# Patient Record
Sex: Female | Born: 1990 | Race: White | Hispanic: No | Marital: Single | Smoking: Current every day smoker
Health system: Southern US, Community
[De-identification: ages and names within clinical notes are randomized; demographics above are authoritative.]

## PROBLEM LIST (undated history)

## (undated) DIAGNOSIS — B1920 Unspecified viral hepatitis C without hepatic coma: Secondary | ICD-10-CM

## (undated) DIAGNOSIS — W3400XA Accidental discharge from unspecified firearms or gun, initial encounter: Secondary | ICD-10-CM

## (undated) HISTORY — PX: APPENDECTOMY: SHX54

---

## 2016-01-19 ENCOUNTER — Emergency Department: Payer: Self-pay

## 2016-01-19 ENCOUNTER — Encounter: Payer: Self-pay | Admitting: *Deleted

## 2016-01-19 ENCOUNTER — Emergency Department
Admission: EM | Admit: 2016-01-19 | Discharge: 2016-01-19 | Disposition: A | Discharge status: Home / Self Care | Payer: Self-pay | Attending: Emergency Medicine | Admitting: Emergency Medicine

## 2016-01-19 DIAGNOSIS — F17210 Nicotine dependence, cigarettes, uncomplicated: Secondary | ICD-10-CM | POA: Insufficient documentation

## 2016-01-19 DIAGNOSIS — K5900 Constipation, unspecified: Secondary | ICD-10-CM

## 2016-01-19 DIAGNOSIS — K567 Ileus, unspecified: Secondary | ICD-10-CM | POA: Insufficient documentation

## 2016-01-19 HISTORY — DX: Unspecified viral hepatitis C without hepatic coma: B19.20

## 2016-01-19 HISTORY — DX: Accidental discharge from unspecified firearms or gun, initial encounter: W34.00XA

## 2016-01-19 LAB — URINALYSIS, COMPLETE (UACMP) WITH MICROSCOPIC
BACTERIA UA: NONE SEEN
Glucose, UA: NEGATIVE mg/dL
Hgb urine dipstick: NEGATIVE
KETONES UR: NEGATIVE mg/dL
Nitrite: NEGATIVE
PROTEIN: NEGATIVE mg/dL
Specific Gravity, Urine: 1.026 (ref 1.005–1.030)
pH: 7 (ref 5.0–8.0)

## 2016-01-19 LAB — POCT PREGNANCY, URINE: Preg Test, Ur: NEGATIVE

## 2016-01-19 LAB — COMPREHENSIVE METABOLIC PANEL
ALBUMIN: 4.7 g/dL (ref 3.5–5.0)
ALT: 15 U/L (ref 14–54)
AST: 23 U/L (ref 15–41)
Alkaline Phosphatase: 69 U/L (ref 38–126)
Anion gap: 8 (ref 5–15)
BUN: 12 mg/dL (ref 6–20)
CHLORIDE: 106 mmol/L (ref 101–111)
CO2: 26 mmol/L (ref 22–32)
CREATININE: 0.84 mg/dL (ref 0.44–1.00)
Calcium: 9.3 mg/dL (ref 8.9–10.3)
GFR calc Af Amer: >60 mL/min (ref >60)
GFR calc non Af Amer: >60 mL/min (ref >60)
GLUCOSE: 115 mg/dL — AB (ref 65–99)
POTASSIUM: 3.8 mmol/L (ref 3.5–5.1)
Sodium: 140 mmol/L (ref 135–145)
TOTAL PROTEIN: 8.2 g/dL — AB (ref 6.5–8.1)

## 2016-01-19 LAB — CBC
HEMATOCRIT: 42.3 % (ref 35.0–47.0)
Hemoglobin: 14.4 g/dL (ref 12.0–16.0)
MCH: 31.1 pg (ref 26.0–34.0)
MCHC: 34 g/dL (ref 32.0–36.0)
MCV: 91.5 fL (ref 80.0–100.0)
Platelets: 339 10*3/uL (ref 150–440)
RBC: 4.62 MIL/uL (ref 3.80–5.20)
RDW: 14.4 % (ref 11.5–14.5)
WBC: 9.9 10*3/uL (ref 3.6–11.0)

## 2016-01-19 LAB — LIPASE, BLOOD: Lipase: 35 U/L (ref 11–51)

## 2016-01-19 MED ORDER — SENNOSIDES-DOCUSATE SODIUM 8.6-50 MG PO TABS: 2.0000 | ORAL_TABLET | Freq: Two times a day (BID) | ORAL | 0 refills | Status: AC

## 2016-01-19 MED ORDER — SENNOSIDES-DOCUSATE SODIUM 8.6-50 MG PO TABS
2.0000 | ORAL_TABLET | Freq: Once | ORAL | Status: AC
  Administered 2016-01-19: 2 via ORAL
  Filled 2016-01-19: qty 2
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
## 2016-01-19 NOTE — ED Notes (Signed)
Patient transported to X-ray 
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
## 2016-01-21 ENCOUNTER — Encounter: Payer: Self-pay | Admitting: Emergency Medicine

## 2016-01-21 ENCOUNTER — Observation Stay
Admission: EM | Admit: 2016-01-21 | Discharge: 2016-01-23 | Disposition: A | Discharge status: Home / Self Care | Payer: Self-pay | Attending: Internal Medicine | Admitting: Internal Medicine

## 2016-01-21 ENCOUNTER — Emergency Department: Payer: Self-pay

## 2016-01-21 DIAGNOSIS — G4700 Insomnia, unspecified: Secondary | ICD-10-CM | POA: Insufficient documentation

## 2016-01-21 DIAGNOSIS — F17200 Nicotine dependence, unspecified, uncomplicated: Secondary | ICD-10-CM | POA: Insufficient documentation

## 2016-01-21 DIAGNOSIS — R1084 Generalized abdominal pain: Secondary | ICD-10-CM | POA: Insufficient documentation

## 2016-01-21 DIAGNOSIS — F1120 Opioid dependence, uncomplicated: Secondary | ICD-10-CM

## 2016-01-21 DIAGNOSIS — Z880 Allergy status to penicillin: Secondary | ICD-10-CM | POA: Insufficient documentation

## 2016-01-21 DIAGNOSIS — F1123 Opioid dependence with withdrawal: Principal | ICD-10-CM | POA: Insufficient documentation

## 2016-01-21 DIAGNOSIS — A084 Viral intestinal infection, unspecified: Secondary | ICD-10-CM | POA: Insufficient documentation

## 2016-01-21 DIAGNOSIS — R112 Nausea with vomiting, unspecified: Secondary | ICD-10-CM | POA: Diagnosis present

## 2016-01-21 DIAGNOSIS — B1920 Unspecified viral hepatitis C without hepatic coma: Secondary | ICD-10-CM | POA: Insufficient documentation

## 2016-01-21 DIAGNOSIS — F1193 Opioid use, unspecified with withdrawal: Secondary | ICD-10-CM

## 2016-01-21 DIAGNOSIS — Z23 Encounter for immunization: Secondary | ICD-10-CM | POA: Insufficient documentation

## 2016-01-21 LAB — CBC
HCT: 38.7 % (ref 35.0–47.0)
Hemoglobin: 13.4 g/dL (ref 12.0–16.0)
MCH: 31.4 pg (ref 26.0–34.0)
MCHC: 34.7 g/dL (ref 32.0–36.0)
MCV: 90.4 fL (ref 80.0–100.0)
PLATELETS: 338 10*3/uL (ref 150–440)
RBC: 4.28 MIL/uL (ref 3.80–5.20)
RDW: 14.4 % (ref 11.5–14.5)
WBC: 15.8 10*3/uL — AB (ref 3.6–11.0)

## 2016-01-21 LAB — COMPREHENSIVE METABOLIC PANEL
ALBUMIN: 4.4 g/dL (ref 3.5–5.0)
ALK PHOS: 68 U/L (ref 38–126)
ALT: 15 U/L (ref 14–54)
AST: 25 U/L (ref 15–41)
Anion gap: 11 (ref 5–15)
BILIRUBIN TOTAL: 0.4 mg/dL (ref 0.3–1.2)
BUN: 9 mg/dL (ref 6–20)
CALCIUM: 9.4 mg/dL (ref 8.9–10.3)
CO2: 20 mmol/L — ABNORMAL LOW (ref 22–32)
CREATININE: 0.73 mg/dL (ref 0.44–1.00)
Chloride: 111 mmol/L (ref 101–111)
GFR calc Af Amer: >60 mL/min (ref >60)
GLUCOSE: 111 mg/dL — AB (ref 65–99)
Potassium: 4.2 mmol/L (ref 3.5–5.1)
Sodium: 142 mmol/L (ref 135–145)
TOTAL PROTEIN: 7.8 g/dL (ref 6.5–8.1)

## 2016-01-21 LAB — URINALYSIS, COMPLETE (UACMP) WITH MICROSCOPIC
BACTERIA UA: NONE SEEN
Bilirubin Urine: NEGATIVE
GLUCOSE, UA: NEGATIVE mg/dL
Ketones, ur: NEGATIVE mg/dL
Leukocytes, UA: NEGATIVE
NITRITE: NEGATIVE
PH: 7 (ref 5.0–8.0)
Protein, ur: NEGATIVE mg/dL
SPECIFIC GRAVITY, URINE: 1.021 (ref 1.005–1.030)

## 2016-01-21 LAB — URINE DRUG SCREEN, QUALITATIVE (ARMC ONLY)
AMPHETAMINES, UR SCREEN: NOT DETECTED
Barbiturates, Ur Screen: POSITIVE — AB
Benzodiazepine, Ur Scrn: POSITIVE — AB
CANNABINOID 50 NG, UR ~~LOC~~: NOT DETECTED
COCAINE METABOLITE, UR ~~LOC~~: NOT DETECTED
MDMA (ECSTASY) UR SCREEN: NOT DETECTED
Methadone Scn, Ur: NOT DETECTED
Opiate, Ur Screen: NOT DETECTED
PHENCYCLIDINE (PCP) UR S: NOT DETECTED
TRICYCLIC, UR SCREEN: POSITIVE — AB

## 2016-01-21 LAB — LIPASE, BLOOD: Lipase: 28 U/L (ref 11–51)

## 2016-01-21 LAB — OCCULT BLOOD X 1 CARD TO LAB, STOOL: Fecal Occult Bld: NEGATIVE

## 2016-01-21 MED ORDER — ACETAMINOPHEN 325 MG PO TABS
650.0000 mg | ORAL_TABLET | Freq: Four times a day (QID) | ORAL | Status: DC | PRN
  Administered 2016-01-21 – 2016-01-23 (×5): 650 mg via ORAL
  Filled 2016-01-21 (×6): qty 2

## 2016-01-21 MED ORDER — CLONIDINE HCL 0.1 MG/24HR TD PTWK
0.1000 mg | MEDICATED_PATCH | TRANSDERMAL | Status: DC
  Administered 2016-01-21: 0.1 mg via TRANSDERMAL
  Filled 2016-01-21: qty 1

## 2016-01-21 MED ORDER — PNEUMOCOCCAL VAC POLYVALENT 25 MCG/0.5ML IJ INJ
0.5000 mL | INJECTION | INTRAMUSCULAR | Status: AC
  Administered 2016-01-22: 0.5 mL via INTRAMUSCULAR
  Filled 2016-01-21: qty 0.5

## 2016-01-21 MED ORDER — ONDANSETRON HCL 4 MG/2ML IJ SOLN
4.0000 mg | Freq: Once | INTRAMUSCULAR | Status: AC
  Administered 2016-01-21: 4 mg via INTRAVENOUS

## 2016-01-21 MED ORDER — DIPHENHYDRAMINE HCL 50 MG/ML IJ SOLN
12.5000 mg | Freq: Three times a day (TID) | INTRAMUSCULAR | Status: DC | PRN
  Administered 2016-01-21 – 2016-01-22 (×3): 12.5 mg via INTRAVENOUS
  Filled 2016-01-21 (×3): qty 1

## 2016-01-21 MED ORDER — ONDANSETRON 4 MG PO TBDP
4.0000 mg | ORAL_TABLET | Freq: Once | ORAL | Status: AC | PRN
  Administered 2016-01-21: 4 mg via ORAL

## 2016-01-21 MED ORDER — KETOROLAC TROMETHAMINE 15 MG/ML IJ SOLN
15.0000 mg | Freq: Four times a day (QID) | INTRAMUSCULAR | Status: DC | PRN
  Administered 2016-01-21 – 2016-01-23 (×7): 15 mg via INTRAVENOUS
  Filled 2016-01-21 (×7): qty 1

## 2016-01-21 MED ORDER — SODIUM CHLORIDE 0.9 % IV BOLUS (SEPSIS)
1000.0000 mL | Freq: Once | INTRAVENOUS | Status: AC
  Administered 2016-01-21: 1000 mL via INTRAVENOUS

## 2016-01-21 MED ORDER — ONDANSETRON 4 MG PO TBDP
ORAL_TABLET | ORAL | Status: AC
  Administered 2016-01-21: 4 mg via ORAL
  Filled 2016-01-21: qty 1

## 2016-01-21 MED ORDER — BUPRENORPHINE HCL 2 MG SL SUBL
2.0000 mg | SUBLINGUAL_TABLET | Freq: Once | SUBLINGUAL | Status: AC
  Administered 2016-01-21: 2 mg via SUBLINGUAL

## 2016-01-21 MED ORDER — LEVOFLOXACIN IN D5W 250 MG/50ML IV SOLN
250.0000 mg | INTRAVENOUS | Status: DC
  Administered 2016-01-21 – 2016-01-22 (×2): 250 mg via INTRAVENOUS
  Filled 2016-01-21 (×3): qty 50

## 2016-01-21 MED ORDER — ORAL CARE MOUTH RINSE
15.0000 mL | Freq: Two times a day (BID) | OROMUCOSAL | Status: DC
  Administered 2016-01-21 – 2016-01-23 (×5): 15 mL via OROMUCOSAL

## 2016-01-21 MED ORDER — INFLUENZA VAC SPLIT QUAD 0.5 ML IM SUSY
0.5000 mL | PREFILLED_SYRINGE | INTRAMUSCULAR | Status: AC
  Administered 2016-01-22: 0.5 mL via INTRAMUSCULAR
  Filled 2016-01-21: qty 0.5

## 2016-01-21 MED ORDER — PROMETHAZINE HCL 25 MG/ML IJ SOLN
25.0000 mg | Freq: Once | INTRAMUSCULAR | Status: AC
  Administered 2016-01-21: 25 mg via INTRAVENOUS
  Filled 2016-01-21: qty 1

## 2016-01-21 MED ORDER — KETOROLAC TROMETHAMINE 30 MG/ML IJ SOLN
30.0000 mg | Freq: Once | INTRAMUSCULAR | Status: AC
  Administered 2016-01-21: 30 mg via INTRAVENOUS
  Filled 2016-01-21: qty 1

## 2016-01-21 MED ORDER — ONDANSETRON 4 MG PO TBDP
4.0000 mg | ORAL_TABLET | Freq: Once | ORAL | Status: AC
  Administered 2016-01-21: 4 mg via ORAL

## 2016-01-21 MED ORDER — ONDANSETRON HCL 4 MG/2ML IJ SOLN
INTRAMUSCULAR | Status: AC
  Administered 2016-01-21: 4 mg via INTRAVENOUS
  Filled 2016-01-21: qty 2

## 2016-01-21 MED ORDER — FAMOTIDINE IN NACL 20-0.9 MG/50ML-% IV SOLN
20.0000 mg | Freq: Two times a day (BID) | INTRAVENOUS | Status: DC
  Administered 2016-01-21 – 2016-01-22 (×3): 20 mg via INTRAVENOUS
  Filled 2016-01-21 (×5): qty 50

## 2016-01-21 MED ORDER — ONDANSETRON HCL 4 MG/2ML IJ SOLN
4.0000 mg | Freq: Four times a day (QID) | INTRAMUSCULAR | Status: DC | PRN
  Administered 2016-01-21 – 2016-01-22 (×6): 4 mg via INTRAVENOUS
  Filled 2016-01-21 (×5): qty 2

## 2016-01-21 MED ORDER — ACETAMINOPHEN 650 MG RE SUPP: 650.0000 mg | Freq: Four times a day (QID) | RECTAL | Status: DC | PRN

## 2016-01-21 MED ORDER — KETOROLAC TROMETHAMINE 15 MG/ML IJ SOLN
15.0000 mg | Freq: Once | INTRAMUSCULAR | Status: AC
Start: 2016-01-21 — End: 2016-01-21
  Administered 2016-01-21: 15 mg via INTRAVENOUS
  Filled 2016-01-21: qty 1

## 2016-01-21 MED ORDER — METOCLOPRAMIDE HCL 5 MG/ML IJ SOLN
5.0000 mg | Freq: Four times a day (QID) | INTRAMUSCULAR | Status: DC | PRN
  Administered 2016-01-21 – 2016-01-23 (×5): 5 mg via INTRAVENOUS
  Filled 2016-01-21 (×6): qty 2

## 2016-01-21 MED ORDER — SODIUM CHLORIDE 0.9 % IV SOLN
INTRAVENOUS | Status: AC
  Administered 2016-01-21 – 2016-01-22 (×3): via INTRAVENOUS

## 2016-01-21 MED ORDER — LIDOCAINE 5 % EX PTCH
1.0000 | MEDICATED_PATCH | CUTANEOUS | Status: DC
  Administered 2016-01-21 – 2016-01-22 (×2): 1 via TRANSDERMAL
  Filled 2016-01-21 (×3): qty 1

## 2016-01-21 MED ORDER — LORAZEPAM 2 MG/ML IJ SOLN
1.0000 mg | Freq: Once | INTRAMUSCULAR | Status: AC
  Administered 2016-01-21: 1 mg via INTRAVENOUS
  Filled 2016-01-21: qty 1

## 2016-01-21 MED ORDER — IOPAMIDOL (ISOVUE-300) INJECTION 61%
30.0000 mL | INTRAVENOUS | Status: AC
  Administered 2016-01-21: 30 mL via ORAL

## 2016-01-21 MED ORDER — ONDANSETRON HCL 4 MG/2ML IJ SOLN
4.0000 mg | Freq: Once | INTRAMUSCULAR | Status: AC
Start: 2016-01-21 — End: 2016-01-21
  Administered 2016-01-21: 4 mg via INTRAVENOUS
  Filled 2016-01-21: qty 2

## 2016-01-21 MED ORDER — FAMOTIDINE IN NACL 20-0.9 MG/50ML-% IV SOLN
20.0000 mg | INTRAVENOUS | Status: DC
  Administered 2016-01-21: 20 mg via INTRAVENOUS
  Filled 2016-01-21: qty 50

## 2016-01-21 MED ORDER — IOPAMIDOL (ISOVUE-300) INJECTION 61%
75.0000 mL | Freq: Once | INTRAVENOUS | Status: AC | PRN
  Administered 2016-01-21: 75 mL via INTRAVENOUS

## 2016-01-21 NOTE — ED Provider Notes (Signed)
CT of the abdomen and pelvis with contrast IMPRESSION: No acute abnormality.  Patient with intractable vomiting. I will discuss with the hospitalist for admission    Emily FilbertJonathan E Williams, MD 01/21/16 [phone]

## 2016-01-21 NOTE — ED Notes (Signed)
ED Provider at bedside. 

## 2016-01-21 NOTE — ED Triage Notes (Signed)
Pt ambulatory to triage with steady gait, no distress noted. Pt from [facility] due to emesis x1 day. [facility] called spoke with RN, wants pt to have evaluation of meds that she was D/C with for "ileus." Pt d/c from ED on Thursday.

## 2016-01-21 NOTE — ED Notes (Signed)
Pam, Ed tech called to transport patient to floor
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
## 2016-01-21 NOTE — Progress Notes (Signed)
Pt states that she was not able to be honest w/Psychiatrist because her mother-in-law was in the room at the time but she was able to epxress to me the following:  Pt states that she "shoots" heroine 3-4 times a day equivilant to 2-3 grams and that she normally takes a "a whole strip" of suboxone which is equilavent to 8 mg/day.
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
## 2016-01-21 NOTE — ED Notes (Signed)
Pt was incontinent of loose stool and given clean scrub pants and clean wipes.

## 2016-01-21 NOTE — ED Notes (Signed)
[facility] called to report that pt was seen here yesterday for same and is stating that meds they are giving her are not working; [employer] requests no narcs be [employer] Corporationadmin

## 2016-01-21 NOTE — Progress Notes (Signed)
Pharmacy Antibiotic Note  Vanessa SladeKati Barnett is a 26 y.o. female admitted on 01/21/2016 with UTI.  Pharmacy has been consulted for levofloxacin dosing.  Plan: Levofloxacin 250 mg IV daily.  Monitor for change to PO antibiotics as soon as N/V resolves. Pregnancy test negative on 01/19/16  Height: 5\' 4"  (162.6 cm) Weight: 105 lb (47.6 kg) IBW/kg (Calculated) : 54.7  Temp (24hrs), Avg:98.4 F (36.9 C), Min:98.3 F (36.8 C), Max:98.5 F (36.9 C)   Recent Labs Lab 01/19/16 1759 01/21/16 0120  WBC 9.9 15.8*  CREATININE 0.84 0.73    Estimated Creatinine Clearance: 80.8 mL/min (by C-G formula based on SCr of 0.73 mg/dL).    Allergies  Allergen Reactions  . Penicillins Hives and Rash    Has patient had a PCN reaction causing immediate rash, facial/tongue/throat swelling, SOB or lightheadedness with hypotension: yes Has patient had a PCN reaction causing severe rash involving mucus membranes or skin necrosis: no Has patient had a PCN reaction that required hospitalization no Has patient had a PCN reaction occurring within the last 10 years: yes If all of the above answers are "NO", then may proceed with Cephalosporin use.    Antimicrobials this admission: levofloxacin 1/12 >>   Dose adjustments this admission:  Microbiology results: 1/12 UCx: Sent  1/12 Stool: Sent  Thank you for allowing pharmacy to be a part of this patient's care.  Cindi CarbonMary M Arlesia Kiel, PharmD, BCPS Clinical Pharmacist 01/21/2016 3:36 PM
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
## 2016-01-21 NOTE — ED Notes (Signed)
Pt being transported to floor by tech

## 2016-01-22 LAB — GASTROINTESTINAL PANEL BY PCR, STOOL (REPLACES STOOL CULTURE)
ADENOVIRUS F40/41: NOT DETECTED
ASTROVIRUS: NOT DETECTED
CAMPYLOBACTER SPECIES: NOT DETECTED
CYCLOSPORA CAYETANENSIS: NOT DETECTED
Cryptosporidium: NOT DETECTED
ENTEROAGGREGATIVE E COLI (EAEC): NOT DETECTED
ENTEROPATHOGENIC E COLI (EPEC): NOT DETECTED
ENTEROTOXIGENIC E COLI (ETEC): NOT DETECTED
Entamoeba histolytica: NOT DETECTED
GIARDIA LAMBLIA: NOT DETECTED
Norovirus GI/GII: NOT DETECTED
PLESIMONAS SHIGELLOIDES: NOT DETECTED
Rotavirus A: NOT DETECTED
Salmonella species: NOT DETECTED
Sapovirus (I, II, IV, and V): NOT DETECTED
Shiga like toxin producing E coli (STEC): NOT DETECTED
Shigella/Enteroinvasive E coli (EIEC): NOT DETECTED
VIBRIO SPECIES: NOT DETECTED
Vibrio cholerae: NOT DETECTED
Yersinia enterocolitica: NOT DETECTED

## 2016-01-22 LAB — COMPREHENSIVE METABOLIC PANEL
ALBUMIN: 3.8 g/dL (ref 3.5–5.0)
ALK PHOS: 58 U/L (ref 38–126)
ALT: 12 U/L — ABNORMAL LOW (ref 14–54)
AST: 17 U/L (ref 15–41)
Anion gap: 7 (ref 5–15)
BUN: 13 mg/dL (ref 6–20)
CALCIUM: 8.9 mg/dL (ref 8.9–10.3)
CHLORIDE: 111 mmol/L (ref 101–111)
CO2: 21 mmol/L — AB (ref 22–32)
CREATININE: 0.63 mg/dL (ref 0.44–1.00)
GFR calc Af Amer: >60 mL/min (ref >60)
GFR calc non Af Amer: >60 mL/min (ref >60)
GLUCOSE: 107 mg/dL — AB (ref 65–99)
Potassium: 3.8 mmol/L (ref 3.5–5.1)
SODIUM: 139 mmol/L (ref 135–145)
Total Bilirubin: 0.8 mg/dL (ref 0.3–1.2)
Total Protein: 6.8 g/dL (ref 6.5–8.1)

## 2016-01-22 LAB — CBC
HCT: 35.1 % (ref 35.0–47.0)
HEMOGLOBIN: 12 g/dL (ref 12.0–16.0)
MCH: 31.3 pg (ref 26.0–34.0)
MCHC: 34.2 g/dL (ref 32.0–36.0)
MCV: 91.3 fL (ref 80.0–100.0)
PLATELETS: 325 10*3/uL (ref 150–440)
RBC: 3.85 MIL/uL (ref 3.80–5.20)
RDW: 14.3 % (ref 11.5–14.5)
WBC: 11.7 10*3/uL — ABNORMAL HIGH (ref 3.6–11.0)

## 2016-01-22 LAB — C DIFFICILE QUICK SCREEN W PCR REFLEX
C Diff antigen: NEGATIVE
C Diff interpretation: NOT DETECTED
C Diff toxin: NEGATIVE

## 2016-01-22 MED ORDER — BUPRENORPHINE HCL 2 MG SL SUBL
2.0000 mg | SUBLINGUAL_TABLET | SUBLINGUAL | Status: AC
  Administered 2016-01-22: 2 mg via SUBLINGUAL
  Filled 2016-01-22: qty 1

## 2016-01-22 MED ORDER — GABAPENTIN 300 MG PO CAPS
300.0000 mg | ORAL_CAPSULE | Freq: Three times a day (TID) | ORAL | Status: DC | PRN
  Administered 2016-01-22 – 2016-01-23 (×3): 300 mg via ORAL
  Filled 2016-01-22 (×4): qty 1

## 2016-01-22 MED ORDER — TRAZODONE HCL 50 MG PO TABS
50.0000 mg | ORAL_TABLET | Freq: Every evening | ORAL | Status: DC | PRN
  Administered 2016-01-22: 50 mg via ORAL
  Filled 2016-01-22: qty 1
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
## 2016-01-23 LAB — URINE CULTURE
Culture: NO GROWTH
SPECIAL REQUESTS: NORMAL

## 2016-01-23 MED ORDER — ACETAMINOPHEN 325 MG PO TABS: 650.0000 mg | ORAL_TABLET | Freq: Four times a day (QID) | ORAL | Status: AC | PRN

## 2016-01-23 MED ORDER — DICYCLOMINE HCL 20 MG PO TABS: 20.0000 mg | ORAL_TABLET | Freq: Four times a day (QID) | ORAL | 0 refills | Status: AC | PRN
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
## 2016-01-23 NOTE — Progress Notes (Signed)
Patient discharged to home as ordered. Patient tolerated soft diet and denies nausea and vomiting and pain at this time. Patient is alert and oriented ambulates well without assistance. Prescription given to patient for tylenol and bentyl. Patients parents at the bedside to take patient home.

## 2016-01-23 NOTE — Discharge Instructions (Signed)
Outpatient follow-up with the detox center tomorrow Outpatient follow[facility] Outpatient follow-up with psychiatry in a week

## 2018-03-21 IMAGING — CR DG ABDOMEN 2V
1 series · 2 of 2 positions shown · non-contrast
Comparison: None.

CLINICAL DATA: Abdominal pain and constipation for 11 days.

EXAM:
ABDOMEN - 2 VIEW

[Series 1: w abdomen upright · 0.14mm/px · 2 of 2 slices shown]
[im 1/2]
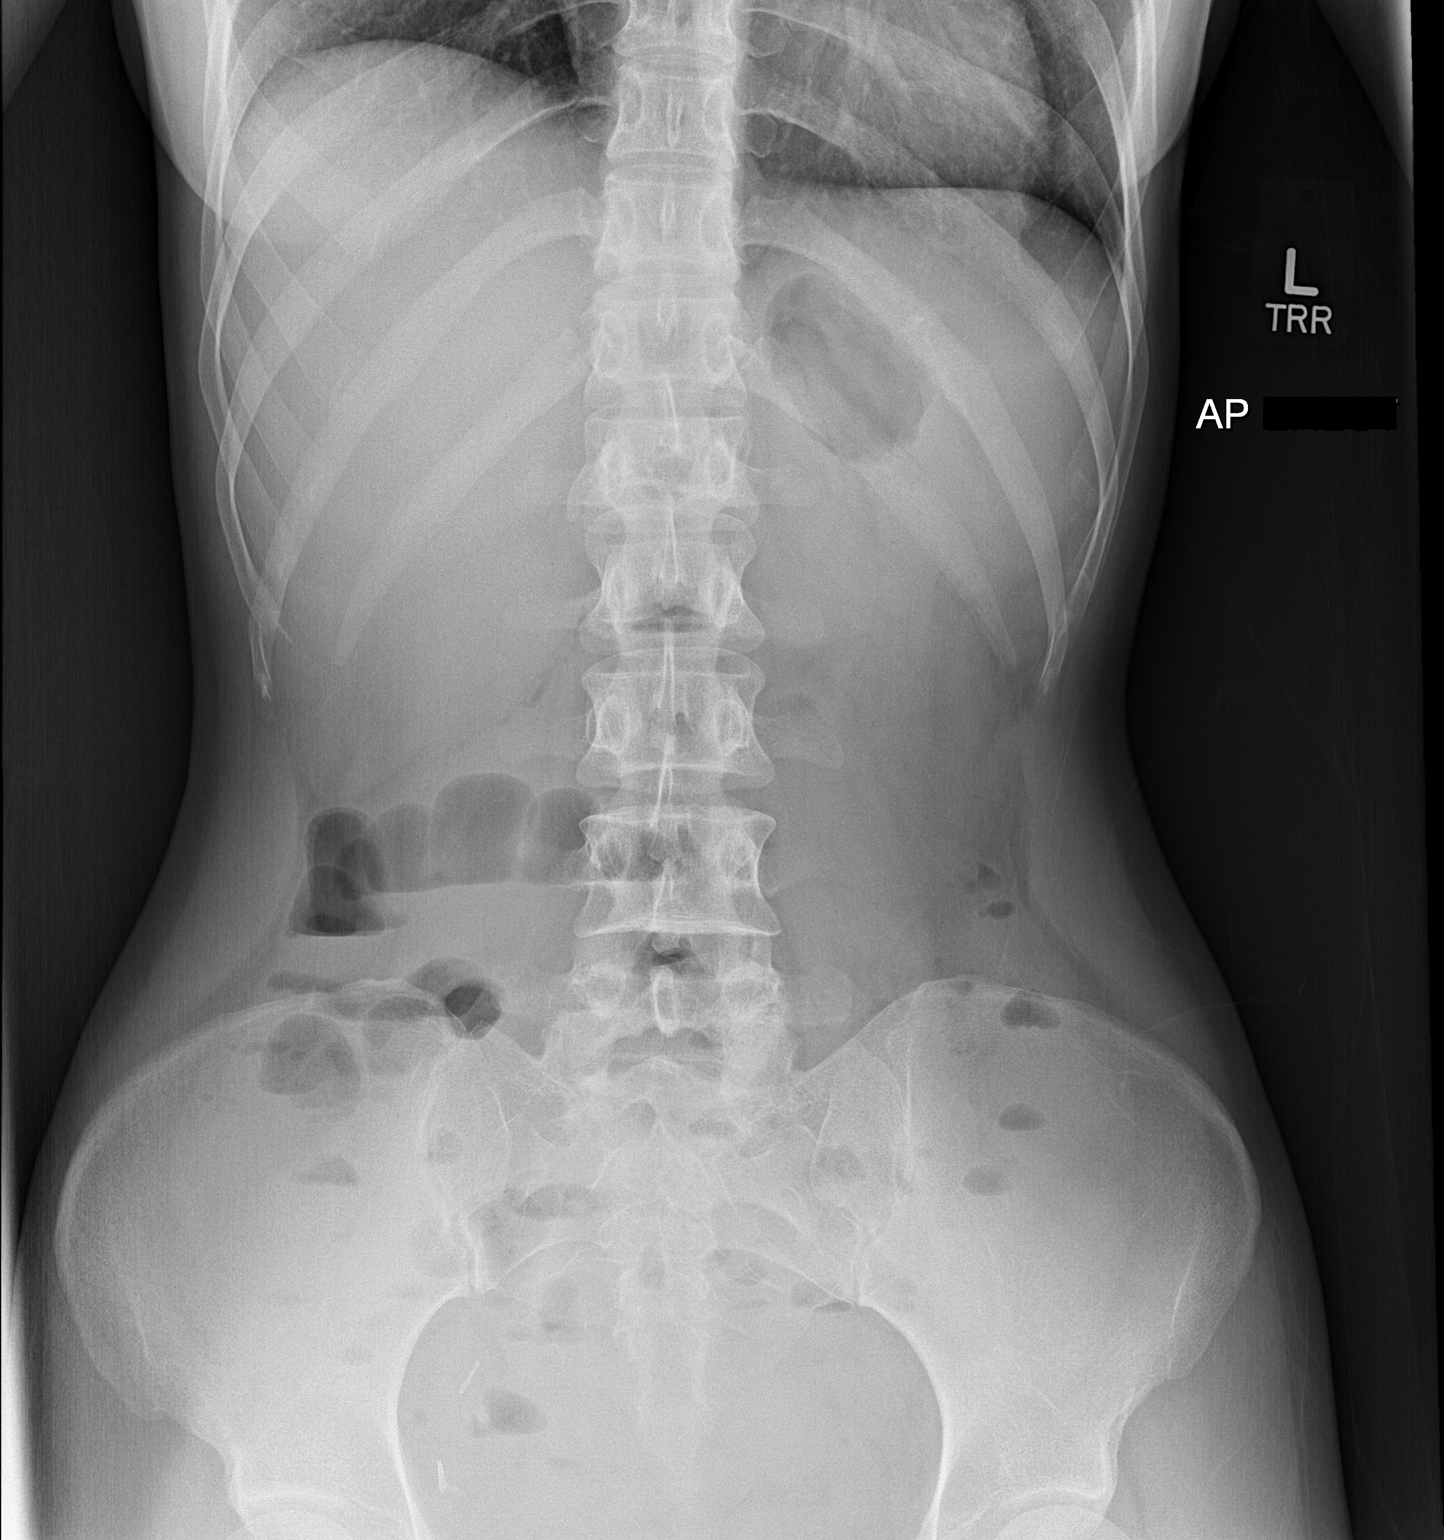
[im 2/2]
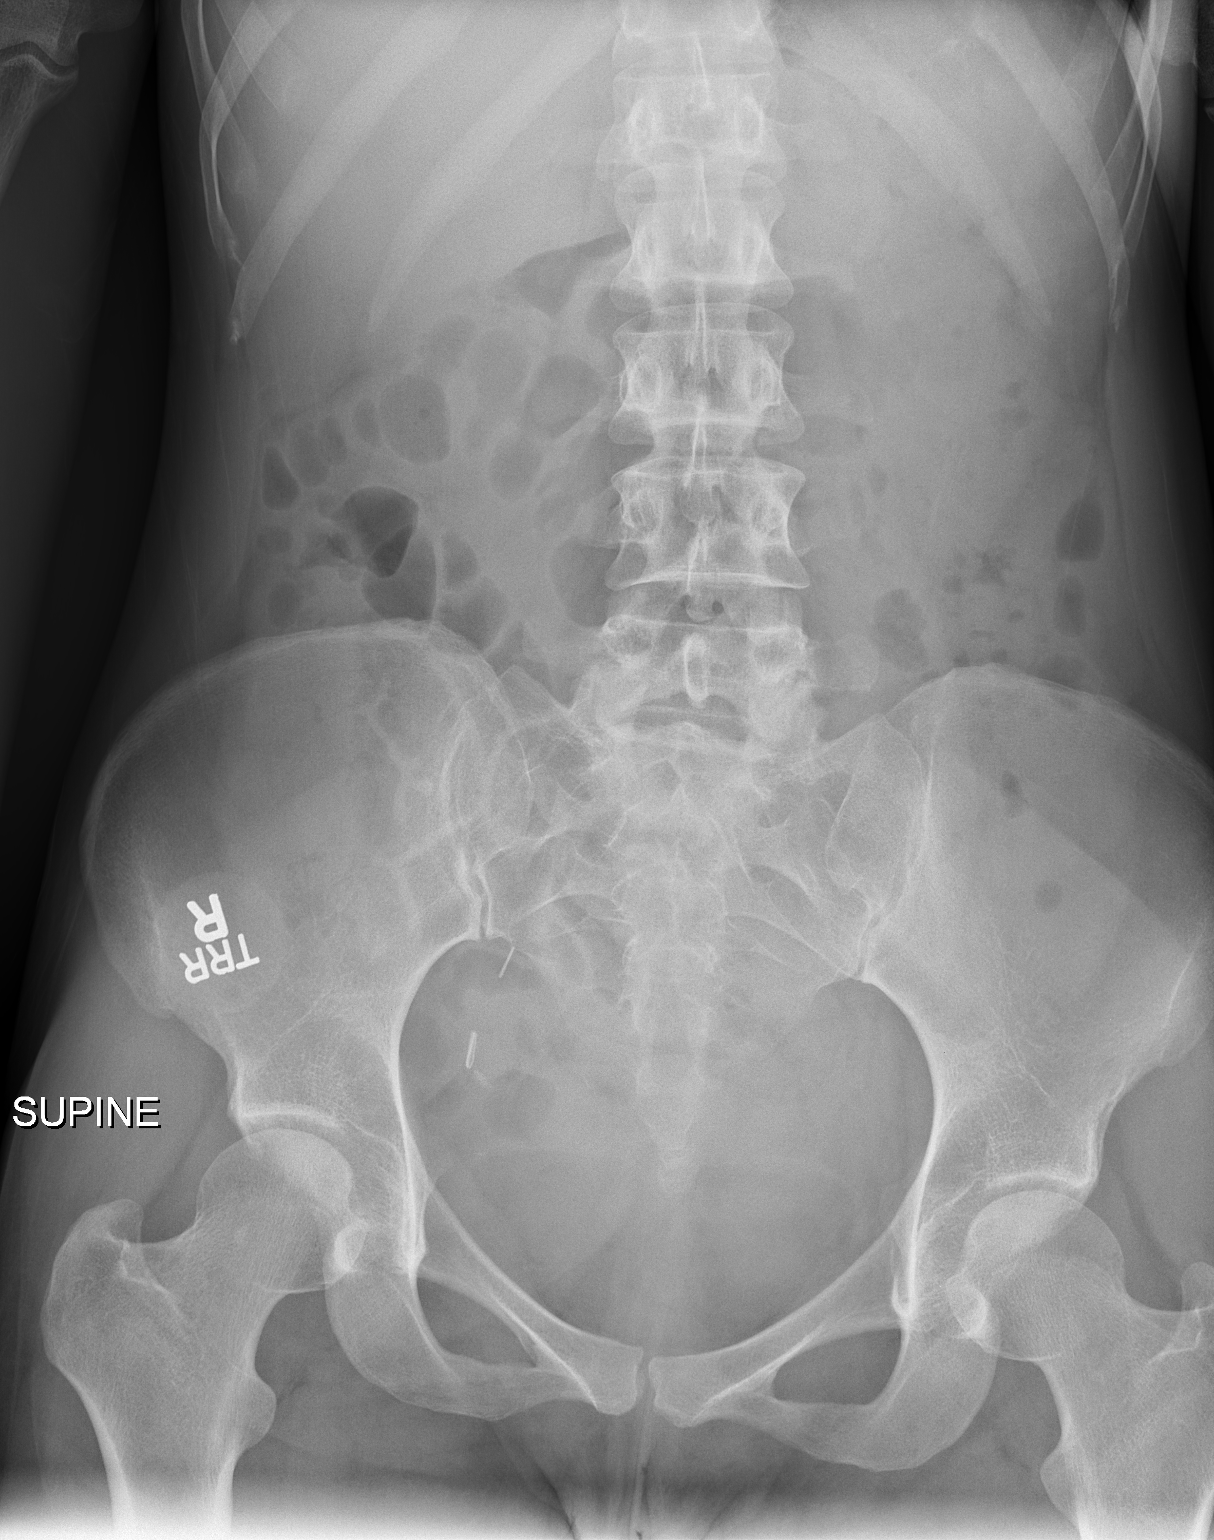

[2 of 2 positions shown; findings below may reference images not displayed]

FINDINGS: Fluid levels are present in what appears to be large and small bowel
in the lower abdomen and pelvis. There is no obstruction or free
air. The axial skeleton is unremarkable. Surgical clips are present
within the right pelvis.

The lung bases are clear.
IMPRESSION: Fluid levels within nondilated loops of bowel suggesting adynamic
ileus.
